# Patient Record
Sex: Female | Born: 1960 | Race: Black or African American | Hispanic: No | Marital: Married | Smoking: Never smoker
Health system: Southern US, Community
[De-identification: ages and names within clinical notes are randomized; demographics above are authoritative.]

## PROBLEM LIST (undated history)

## (undated) DIAGNOSIS — M25562 Pain in left knee: Secondary | ICD-10-CM

## (undated) DIAGNOSIS — N915 Oligomenorrhea, unspecified: Secondary | ICD-10-CM

## (undated) DIAGNOSIS — T7840XA Allergy, unspecified, initial encounter: Secondary | ICD-10-CM

## (undated) DIAGNOSIS — I10 Essential (primary) hypertension: Secondary | ICD-10-CM

## (undated) DIAGNOSIS — E669 Obesity, unspecified: Secondary | ICD-10-CM

## (undated) DIAGNOSIS — K921 Melena: Secondary | ICD-10-CM

## (undated) DIAGNOSIS — N650 Deformity of reconstructed breast: Secondary | ICD-10-CM

## (undated) DIAGNOSIS — J3089 Other allergic rhinitis: Secondary | ICD-10-CM

## (undated) DIAGNOSIS — D219 Benign neoplasm of connective and other soft tissue, unspecified: Secondary | ICD-10-CM

## (undated) DIAGNOSIS — E559 Vitamin D deficiency, unspecified: Secondary | ICD-10-CM

## (undated) DIAGNOSIS — N870 Mild cervical dysplasia: Secondary | ICD-10-CM

## (undated) DIAGNOSIS — D126 Benign neoplasm of colon, unspecified: Secondary | ICD-10-CM

## (undated) DIAGNOSIS — Z6834 Body mass index (BMI) 34.0-34.9, adult: Secondary | ICD-10-CM

## (undated) DIAGNOSIS — Z8619 Personal history of other infectious and parasitic diseases: Secondary | ICD-10-CM

## (undated) DIAGNOSIS — IMO0001 Reserved for inherently not codable concepts without codable children: Secondary | ICD-10-CM

## (undated) HISTORY — DX: Reserved for inherently not codable concepts without codable children: IMO0001

## (undated) HISTORY — DX: Allergy, unspecified, initial encounter: T78.40XA

## (undated) HISTORY — DX: Benign neoplasm of connective and other soft tissue, unspecified: D21.9

## (undated) HISTORY — DX: Obesity, unspecified: E66.9

## (undated) HISTORY — DX: Oligomenorrhea, unspecified: N91.5

## (undated) HISTORY — DX: Body mass index (BMI) 34.0-34.9, adult: Z68.34

## (undated) HISTORY — DX: Essential (primary) hypertension: I10

## (undated) HISTORY — DX: Deformity of reconstructed breast: N65.0

## (undated) HISTORY — DX: Personal history of other infectious and parasitic diseases: Z86.19

## (undated) HISTORY — DX: Other allergic rhinitis: J30.89

## (undated) HISTORY — DX: Melena: K92.1

## (undated) HISTORY — DX: Vitamin D deficiency, unspecified: E55.9

## (undated) HISTORY — DX: Pain in left knee: M25.562

## (undated) HISTORY — DX: Benign neoplasm of colon, unspecified: D12.6

## (undated) HISTORY — DX: Mild cervical dysplasia: N87.0

---

## 2001-09-13 HISTORY — PX: FOOT BONE EXCISION: SUR493

## 2002-07-14 DIAGNOSIS — D126 Benign neoplasm of colon, unspecified: Secondary | ICD-10-CM | POA: Insufficient documentation

## 2002-07-14 HISTORY — DX: Benign neoplasm of colon, unspecified: D12.6

## 2003-06-12 ENCOUNTER — Other Ambulatory Visit: Admission: RE | Admit: 2003-06-12 | Discharge: 2003-06-12 | Payer: Self-pay | Admitting: Obstetrics and Gynecology

## 2003-06-21 ENCOUNTER — Ambulatory Visit (HOSPITAL_COMMUNITY): Admission: RE | Admit: 2003-06-21 | Discharge: 2003-06-21 | Payer: Self-pay | Admitting: Obstetrics and Gynecology

## 2003-06-21 ENCOUNTER — Encounter: Payer: Self-pay | Admitting: Obstetrics and Gynecology

## 2004-08-31 ENCOUNTER — Other Ambulatory Visit: Admission: RE | Admit: 2004-08-31 | Discharge: 2004-08-31 | Payer: Self-pay | Admitting: Obstetrics and Gynecology

## 2004-10-08 ENCOUNTER — Ambulatory Visit (HOSPITAL_COMMUNITY): Admission: RE | Admit: 2004-10-08 | Discharge: 2004-10-08 | Payer: Self-pay | Admitting: Obstetrics and Gynecology

## 2005-09-29 ENCOUNTER — Other Ambulatory Visit: Admission: RE | Admit: 2005-09-29 | Discharge: 2005-09-29 | Payer: Self-pay | Admitting: Obstetrics and Gynecology

## 2005-10-19 ENCOUNTER — Ambulatory Visit (HOSPITAL_COMMUNITY): Admission: RE | Admit: 2005-10-19 | Discharge: 2005-10-19 | Payer: Self-pay | Admitting: Obstetrics and Gynecology

## 2006-12-29 ENCOUNTER — Ambulatory Visit (HOSPITAL_COMMUNITY): Admission: RE | Admit: 2006-12-29 | Discharge: 2006-12-29 | Payer: Self-pay | Admitting: Obstetrics and Gynecology

## 2008-01-09 ENCOUNTER — Encounter: Admission: RE | Admit: 2008-01-09 | Discharge: 2008-01-09 | Payer: Self-pay | Admitting: Obstetrics and Gynecology

## 2009-09-29 ENCOUNTER — Ambulatory Visit (HOSPITAL_COMMUNITY): Admission: RE | Admit: 2009-09-29 | Discharge: 2009-09-29 | Payer: Self-pay | Admitting: Obstetrics and Gynecology

## 2010-10-02 ENCOUNTER — Ambulatory Visit (HOSPITAL_COMMUNITY)
Admission: RE | Admit: 2010-10-02 | Discharge: 2010-10-02 | Payer: Self-pay | Source: Home / Self Care | Attending: Obstetrics and Gynecology | Admitting: Obstetrics and Gynecology

## 2010-10-04 ENCOUNTER — Encounter: Payer: Self-pay | Admitting: Obstetrics and Gynecology

## 2011-04-15 ENCOUNTER — Encounter: Payer: Self-pay | Admitting: *Deleted

## 2011-04-15 ENCOUNTER — Encounter: Payer: BC Managed Care – PPO | Attending: Family Medicine | Admitting: *Deleted

## 2011-04-15 DIAGNOSIS — Z713 Dietary counseling and surveillance: Secondary | ICD-10-CM | POA: Insufficient documentation

## 2011-04-15 DIAGNOSIS — E669 Obesity, unspecified: Secondary | ICD-10-CM | POA: Insufficient documentation

## 2011-04-15 NOTE — Patient Instructions (Addendum)
Goals:  Eat 3 meals/day, Avoid meal skipping.  Increase protein rich foods to all meals and snacks.  Follow "Plate Method" for portion control (follow yellow card).  Choose more whole grains, lean protein, low-fat dairy, and fruits/non-starchy vegetables.   Aim for >30 min of physical activity daily.  Limit sugar-sweetened beverages, concentrated sweets, and fried foods.  Limit meals away from home.  Increase fiber to 25-30 grams and limit sodium to <2000 mg daily.  Keep food journal for 1 week and bring to next visit. Measure portions to get familiar with amounts.

## 2011-04-15 NOTE — Progress Notes (Signed)
Medical Nutrition Therapy    Appt start time: 12:00p  end time: 1:00p.  Assessment:  Primary concerns today: Obesity.  Pt here for diet therapy r/t obesity. Reports skipping breakfast 4 d/wk, though has stopped consuming fried foods, sodas, and sweet tea. States she no longer eats out.  No structured exercise noted, though pt states she plans to resume.  Food recall shows excessive CHO at night, which she states is her "problem" area.  Pt started phentermine a few weeks ago, which "seems be helping".  MEDICATIONS: Vit D, lotrel, jolivette (BCP), phentermine (started recently)   DIETARY INTAKE 24-hr recall:  B (AM): Yogurt, cheese, or oatmeal (3x/wk) OR SKIPS; decaf coffee (1 c)  Snk (AM): none  L (PM):  Peanut butter sandwich, carrots or apple; water OR nabs Snk (PM): none D (PM): Baked chicken and pasta; [place] hot dogs (no preservatives), salad, broccoli  Snk (HS): few small bags of chips, lance pb nabs, hummus w/ wheat crackers  Usual physical activity: No exercise noted.  Estimated energy needs: 1300-1400 calories 160-175 g carbohydrates 80-85 g protein 35-40 g fat 25-30 g fiber <2000 mg sodium  Progress Towards Goal(s):  NEW.   Nutritional Diagnosis:  -3.3 Obesity related to excessive intake of CHO as evidenced by pt-reported 24 hour recall and a BMI of 33.3 kg/m2.    Intervention/Goals:  Eat 3 meals/day, Avoid meal skipping.  Increase protein rich foods to all meals and snacks.  Follow "Plate Method" for portion control (follow yellow card).  Choose more whole grains, lean protein, low-fat dairy, and fruits/non-starchy vegetables.   Aim for >30 min of physical activity daily.  Limit sugar-sweetened beverages, concentrated sweets, and fried foods.  Limit meals away from home.  Increase fiber to 25-30 grams and limit sodium to <2000 mg daily.  Keep food journal for 1 week and bring to next visit. Measure portions to get familiar with amounts.     Monitoring/Evaluation:  Dietary intake, exercise, and body weight in 3 month(s).

## 2011-04-17 ENCOUNTER — Encounter: Payer: Self-pay | Admitting: *Deleted

## 2011-09-27 ENCOUNTER — Other Ambulatory Visit (HOSPITAL_COMMUNITY): Payer: Self-pay | Admitting: Obstetrics and Gynecology

## 2011-09-27 DIAGNOSIS — Z1231 Encounter for screening mammogram for malignant neoplasm of breast: Secondary | ICD-10-CM

## 2011-09-27 DIAGNOSIS — Z0000 Encounter for general adult medical examination without abnormal findings: Secondary | ICD-10-CM

## 2011-10-07 ENCOUNTER — Ambulatory Visit (HOSPITAL_COMMUNITY)
Admission: RE | Admit: 2011-10-07 | Discharge: 2011-10-07 | Disposition: A | Discharge status: Home / Self Care | Payer: BC Managed Care – PPO | Source: Ambulatory Visit | Attending: Obstetrics and Gynecology | Admitting: Obstetrics and Gynecology

## 2011-10-07 DIAGNOSIS — Z1231 Encounter for screening mammogram for malignant neoplasm of breast: Secondary | ICD-10-CM

## 2012-04-01 ENCOUNTER — Other Ambulatory Visit: Payer: Self-pay | Admitting: Obstetrics and Gynecology

## 2012-04-03 NOTE — Telephone Encounter (Signed)
Rx for Errin e-pres to pharm on file per pharm request. AEX sched 04-27-12 with Dr. Pennie Rushing.

## 2012-04-04 ENCOUNTER — Ambulatory Visit: Payer: Self-pay | Admitting: Obstetrics and Gynecology

## 2012-04-27 ENCOUNTER — Ambulatory Visit (INDEPENDENT_AMBULATORY_CARE_PROVIDER_SITE_OTHER): Payer: BC Managed Care – PPO | Admitting: Obstetrics and Gynecology

## 2012-04-27 ENCOUNTER — Encounter: Payer: Self-pay | Admitting: Obstetrics and Gynecology

## 2012-04-27 VITALS — BP 110/72 | Ht 68.0 in | Wt 210.0 lb

## 2012-04-27 DIAGNOSIS — J3089 Other allergic rhinitis: Secondary | ICD-10-CM | POA: Insufficient documentation

## 2012-04-27 DIAGNOSIS — D259 Leiomyoma of uterus, unspecified: Secondary | ICD-10-CM

## 2012-04-27 DIAGNOSIS — E559 Vitamin D deficiency, unspecified: Secondary | ICD-10-CM | POA: Insufficient documentation

## 2012-04-27 DIAGNOSIS — Z8619 Personal history of other infectious and parasitic diseases: Secondary | ICD-10-CM | POA: Insufficient documentation

## 2012-04-27 DIAGNOSIS — N650 Deformity of reconstructed breast: Secondary | ICD-10-CM | POA: Insufficient documentation

## 2012-04-27 DIAGNOSIS — E669 Obesity, unspecified: Secondary | ICD-10-CM | POA: Insufficient documentation

## 2012-04-27 DIAGNOSIS — N870 Mild cervical dysplasia: Secondary | ICD-10-CM

## 2012-04-27 DIAGNOSIS — D219 Benign neoplasm of connective and other soft tissue, unspecified: Secondary | ICD-10-CM | POA: Insufficient documentation

## 2012-04-27 DIAGNOSIS — Z124 Encounter for screening for malignant neoplasm of cervix: Secondary | ICD-10-CM

## 2012-04-27 DIAGNOSIS — Z6834 Body mass index (BMI) 34.0-34.9, adult: Secondary | ICD-10-CM | POA: Insufficient documentation

## 2012-04-27 DIAGNOSIS — D126 Benign neoplasm of colon, unspecified: Secondary | ICD-10-CM

## 2012-04-27 DIAGNOSIS — Z309 Encounter for contraceptive management, unspecified: Secondary | ICD-10-CM

## 2012-04-27 DIAGNOSIS — IMO0001 Reserved for inherently not codable concepts without codable children: Secondary | ICD-10-CM | POA: Insufficient documentation

## 2012-04-27 DIAGNOSIS — N915 Oligomenorrhea, unspecified: Secondary | ICD-10-CM

## 2012-04-27 DIAGNOSIS — I10 Essential (primary) hypertension: Secondary | ICD-10-CM

## 2012-04-27 MED ORDER — NORETHINDRONE 0.35 MG PO TABS: 1.0000 | ORAL_TABLET | Freq: Every day | ORAL | Status: AC

## 2012-04-27 NOTE — Progress Notes (Signed)
Subjective:   AEX:  Last Pap: 03/14/2012 WNL: Yes Regular Periods:yes Contraception: BC Pill  Monthly Breast exam:yes Tetanus<52yrs: unsure Nl.Bladder Function:yes Daily BMs:yes Healthy Diet:yes Calcium:no Mammogram:yes Date of Mammogram: 10/07/2011 Exercise:yes Have often Exercise: occasionally Seatbelt: yes Abuse at home: no Stressful work:yes Sigmoid-colonoscopy: 2004 WNL due 2014 with Dr. Loreta Ave Bone Density: No PCP: Joes[facility] Fill[facility], [facility]  Change in PMH: None Change in [facility]: None    Ashley Maddox is a 51 y.o. female, G2P2, who presents for an annual exam. She has started to have occassional hot flashes    History   Social History  . Marital Status: Married    Spouse Name: N/A    Number of Children: N/A  . Years of Education: N/A   Social History Main Topics  . Smoking status: Never Smoker   . Smokeless tobacco: Never Used  . Alcohol Use: No  . Drug Use: No  . Sexually Active: Yes    Birth Control/ Protection: Pill   Other Topics Concern  . None   Social History Narrative  . None    Menstrual cycle:   LMP: Patient's last menstrual period was 12/20/2011.           Cycle: every 3 months with current BCP.    The following portions of the patient's history were reviewed and updated as appropriate: allergies, current medications, past family history, past medical history, past social history, past surgical history and problem list.  Review of Systems Pertinent items are noted in HPI. Breast:Negative for breast lump,nipple discharge or nipple retraction Gastrointestinal: Negative for abdominal pain, change in bowel habits or rectal bleeding Urinary:negative   Objective:    BP 110/72  Ht 5\' 8"  (1.727 m)  Wt 210 lb (95.255 kg)  BMI 31.93 kg/m2  LMP 12/20/2011    Weight:  Wt Readings from Last 1 Encounters:  04/27/12 210 lb (95.255 kg)          BMI: Body mass index is 31.93 kg/(m^2).  General Appearance: Alert, appropriate appearance for age. No  acute distress HEENT: Grossly normal Neck / Thyroid: Supple, no masses, nodes or enlargement Lungs: clear to auscultation bilaterally Back: No CVA tenderness Breast Exam: No masses or nodes.No dimpling, nipple retraction or discharge. Cardiovascular: Regular rate and rhythm. S1, S2, no murmur Gastrointestinal: Soft, non-tender, no masses or organomegaly Pelvic Exam: External genitalia: normal general appearance Vaginal: normal mucosa without prolapse or lesions Cervix: normal appearance Adnexa: no masses Uterus: irregular enlargement Exam limited by body habitus, stable Rectovaginal: normal rectal, no masses Lymphatic Exam: Non-palpable nodes in neck, clavicular, axillary, or inguinal regions Skin: no rash or abnormalities Neurologic: Normal gait and speech, no tremor  Psychiatric: Alert and oriented, appropriate affect.   Wet Prep:not applicable Urinalysis:not applicable UPT: Not done   Assessment:    asymptomatic uterine fibroids  Early symptoms of menopause LGSIL Oligomenorrhea partially due to perimenopause partially due to progesterone only report control pills   Plan:    mammogram pap smear return annually or prn STD screening: declined Contraception:oral progesterone-only contraceptive.  Will continue birth control pills until one year after menopause is documented.      Dierdre Forth MD

## 2012-05-01 LAB — PAP IG W/ RFLX HPV ASCU

## 2012-05-03 LAB — HUMAN PAPILLOMAVIRUS, HIGH RISK: HPV DNA High Risk: NOT DETECTED

## 2012-05-05 ENCOUNTER — Encounter: Payer: Self-pay | Admitting: Obstetrics and Gynecology

## 2012-05-05 DIAGNOSIS — R896 Abnormal cytological findings in specimens from other organs, systems and tissues: Secondary | ICD-10-CM

## 2012-05-08 ENCOUNTER — Encounter: Payer: Self-pay | Admitting: Obstetrics and Gynecology

## 2012-06-14 ENCOUNTER — Telehealth: Payer: Self-pay

## 2012-06-14 NOTE — Telephone Encounter (Signed)
Voice message from pt rgdg pap smear results. Lm on vm for pt to cb.

## 2012-06-15 NOTE — Telephone Encounter (Signed)
Tc from pt. Pt with several questions regarding h/o abn pap smears. Appt sched 06/20/12 with vph to discuss. Pt agrees.

## 2012-06-20 ENCOUNTER — Encounter: Payer: Self-pay | Admitting: Obstetrics and Gynecology

## 2012-06-20 ENCOUNTER — Ambulatory Visit (INDEPENDENT_AMBULATORY_CARE_PROVIDER_SITE_OTHER): Payer: BC Managed Care – PPO | Admitting: Obstetrics and Gynecology

## 2012-06-20 VITALS — BP 108/72 | Temp 98.6°F | Wt 218.0 lb

## 2012-06-20 DIAGNOSIS — R87610 Atypical squamous cells of undetermined significance on cytologic smear of cervix (ASC-US): Secondary | ICD-10-CM

## 2012-06-20 DIAGNOSIS — IMO0002 Reserved for concepts with insufficient information to code with codable children: Secondary | ICD-10-CM

## 2012-06-20 NOTE — Progress Notes (Signed)
GYN PROBLEM VISIT  Subjective: Ms. Ashley Maddox is a 51 y.o. year old female,G2P2, who presents for a problem visit. She wants to discuss followup of abnormal pap smears.  In 2011 she has LGSIL evaluated with colposcopy with biopsies consistent with this pap.  She has had f/u paps that were normal on 2 occassions in 2012.  Her most recent pap 04/27/12 showed ASCUS with negative HR HPV.     Objective:  BP 108/72  Temp 98.6 F (37 C)  Wt 218 lb (98.884 kg)    Assessment: HX LGSIL with most recent pap showing ASCUS and neg HR HPV  Plan: Rationale for recommendation for followup pap in 1 year reviewed in detail as well as recommended protocol for cervical cancer screening in general. Pt had the opportunity to ask questions and agreed to comply with the recommendation. She understands she is to report any change in sx such as vaginal bleeding, dyspareunia, or abnormal vaginal discharge  Return to office 8/14 for aex and pap   Dierdre Forth, MD  06/20/2012 11:49 AM

## 2012-11-08 ENCOUNTER — Other Ambulatory Visit: Payer: Self-pay | Admitting: Obstetrics and Gynecology

## 2012-11-08 DIAGNOSIS — Z1231 Encounter for screening mammogram for malignant neoplasm of breast: Secondary | ICD-10-CM

## 2012-11-15 ENCOUNTER — Ambulatory Visit (HOSPITAL_COMMUNITY): Payer: BC Managed Care – PPO

## 2012-11-21 ENCOUNTER — Ambulatory Visit (HOSPITAL_COMMUNITY)
Admission: RE | Admit: 2012-11-21 | Discharge: 2012-11-21 | Disposition: A | Discharge status: Home / Self Care | Payer: BC Managed Care – PPO | Source: Ambulatory Visit | Attending: Obstetrics and Gynecology | Admitting: Obstetrics and Gynecology

## 2012-11-21 DIAGNOSIS — Z1231 Encounter for screening mammogram for malignant neoplasm of breast: Secondary | ICD-10-CM | POA: Insufficient documentation

## 2012-11-22 ENCOUNTER — Encounter: Payer: Self-pay | Admitting: Obstetrics and Gynecology

## 2012-11-22 DIAGNOSIS — R922 Inconclusive mammogram: Secondary | ICD-10-CM | POA: Insufficient documentation

## 2013-12-26 ENCOUNTER — Other Ambulatory Visit: Payer: Self-pay | Admitting: Obstetrics and Gynecology

## 2013-12-26 DIAGNOSIS — Z1231 Encounter for screening mammogram for malignant neoplasm of breast: Secondary | ICD-10-CM

## 2014-01-03 ENCOUNTER — Ambulatory Visit (HOSPITAL_COMMUNITY): Payer: BC Managed Care – PPO

## 2014-01-11 ENCOUNTER — Ambulatory Visit (HOSPITAL_COMMUNITY)
Admission: RE | Admit: 2014-01-11 | Discharge: 2014-01-11 | Disposition: A | Discharge status: Home / Self Care | Payer: BC Managed Care – PPO | Source: Ambulatory Visit | Attending: Obstetrics and Gynecology | Admitting: Obstetrics and Gynecology

## 2014-01-11 DIAGNOSIS — Z1231 Encounter for screening mammogram for malignant neoplasm of breast: Secondary | ICD-10-CM | POA: Insufficient documentation

## 2014-07-15 ENCOUNTER — Encounter: Payer: Self-pay | Admitting: Obstetrics and Gynecology

## 2015-01-13 ENCOUNTER — Other Ambulatory Visit (HOSPITAL_COMMUNITY): Payer: Self-pay | Admitting: Obstetrics and Gynecology

## 2015-01-13 DIAGNOSIS — Z1231 Encounter for screening mammogram for malignant neoplasm of breast: Secondary | ICD-10-CM

## 2015-01-17 ENCOUNTER — Ambulatory Visit (HOSPITAL_COMMUNITY): Payer: BC Managed Care – PPO

## 2015-01-31 ENCOUNTER — Ambulatory Visit (HOSPITAL_COMMUNITY): Payer: BC Managed Care – PPO

## 2015-02-07 ENCOUNTER — Ambulatory Visit (HOSPITAL_COMMUNITY)
Admission: RE | Admit: 2015-02-07 | Discharge: 2015-02-07 | Disposition: A | Discharge status: Home / Self Care | Payer: BC Managed Care – PPO | Source: Ambulatory Visit | Attending: Obstetrics and Gynecology | Admitting: Obstetrics and Gynecology

## 2015-02-07 DIAGNOSIS — Z1231 Encounter for screening mammogram for malignant neoplasm of breast: Secondary | ICD-10-CM | POA: Insufficient documentation

## 2015-04-11 ENCOUNTER — Encounter: Payer: Self-pay | Admitting: Internal Medicine

## 2015-06-19 ENCOUNTER — Encounter: Payer: BC Managed Care – PPO | Admitting: Internal Medicine

## 2015-07-29 ENCOUNTER — Ambulatory Visit (AMBULATORY_SURGERY_CENTER): Payer: Self-pay

## 2015-07-29 VITALS — Ht 67.5 in | Wt 217.4 lb

## 2015-07-29 DIAGNOSIS — Z86010 Personal history of colonic polyps: Secondary | ICD-10-CM

## 2015-07-29 MED ORDER — NA SULFATE-K SULFATE-MG SULF 17.5-3.13-1.6 GM/177ML PO SOLN: ORAL | Status: AC

## 2015-07-29 NOTE — Progress Notes (Signed)
Per pt, no allergies to soy or egg products.Pt not taking any weight loss meds or using  O2 at home.   Pt came into the office today for her pre-visit prior to her colonoscopy with Dr Hilarie Fredrickson on 08/13/15.Pt states she had a colonoscopy done over 12 years ago,( in [place]) due to blood in stool.She states it was due to stress.The pt will bring a copy of the report to her next appt.

## 2015-08-01 ENCOUNTER — Encounter: Payer: Self-pay | Admitting: Internal Medicine

## 2015-08-13 ENCOUNTER — Encounter: Payer: BC Managed Care – PPO | Admitting: Internal Medicine

## 2017-07-19 ENCOUNTER — Other Ambulatory Visit (HOSPITAL_COMMUNITY): Payer: Self-pay | Admitting: Endocrinology

## 2017-07-19 DIAGNOSIS — C73 Malignant neoplasm of thyroid gland: Secondary | ICD-10-CM

## 2017-07-20 ENCOUNTER — Other Ambulatory Visit (HOSPITAL_COMMUNITY): Payer: Self-pay | Admitting: Endocrinology

## 2017-07-20 DIAGNOSIS — C73 Malignant neoplasm of thyroid gland: Secondary | ICD-10-CM

## 2017-08-24 ENCOUNTER — Encounter (HOSPITAL_COMMUNITY): Admission: RE | Admit: 2017-08-24 | Payer: BC Managed Care – PPO | Source: Ambulatory Visit

## 2017-08-25 ENCOUNTER — Encounter (HOSPITAL_COMMUNITY): Payer: BC Managed Care – PPO

## 2017-08-26 ENCOUNTER — Encounter (HOSPITAL_COMMUNITY): Payer: BC Managed Care – PPO

## 2017-08-29 ENCOUNTER — Encounter (HOSPITAL_COMMUNITY)
Admission: RE | Admit: 2017-08-29 | Discharge: 2017-08-29 | Disposition: A | Discharge status: Home / Self Care | Payer: BC Managed Care – PPO | Source: Ambulatory Visit | Attending: Endocrinology | Admitting: Endocrinology

## 2017-08-29 DIAGNOSIS — C73 Malignant neoplasm of thyroid gland: Secondary | ICD-10-CM | POA: Diagnosis present

## 2017-08-29 MED ORDER — STERILE WATER FOR INJECTION IJ SOLN
INTRAMUSCULAR | Status: AC
Start: 2017-08-29 — End: 2017-08-29
  Filled 2017-08-29: qty 10

## 2017-08-29 MED ORDER — THYROTROPIN ALFA 1.1 MG IM SOLR
0.9000 mg | INTRAMUSCULAR | Status: AC
  Administered 2017-08-29: 0.9 mg via INTRAMUSCULAR

## 2017-08-30 ENCOUNTER — Encounter (HOSPITAL_COMMUNITY)
Admission: RE | Admit: 2017-08-30 | Discharge: 2017-08-30 | Disposition: A | Discharge status: Home / Self Care | Payer: BC Managed Care – PPO | Source: Ambulatory Visit | Attending: Endocrinology | Admitting: Endocrinology

## 2017-08-30 DIAGNOSIS — C73 Malignant neoplasm of thyroid gland: Secondary | ICD-10-CM | POA: Diagnosis not present

## 2017-08-30 MED ORDER — THYROTROPIN ALFA 1.1 MG IM SOLR
0.9000 mg | INTRAMUSCULAR | Status: AC
  Administered 2017-08-30: 0.9 mg via INTRAMUSCULAR

## 2017-08-30 MED ORDER — STERILE WATER FOR INJECTION IJ SOLN
INTRAMUSCULAR | Status: AC
  Filled 2017-08-30: qty 10

## 2017-08-31 ENCOUNTER — Encounter (HOSPITAL_COMMUNITY)
Admission: RE | Admit: 2017-08-31 | Discharge: 2017-08-31 | Disposition: A | Discharge status: Home / Self Care | Payer: BC Managed Care – PPO | Source: Ambulatory Visit | Attending: Endocrinology | Admitting: Endocrinology

## 2017-08-31 DIAGNOSIS — C73 Malignant neoplasm of thyroid gland: Secondary | ICD-10-CM | POA: Diagnosis not present

## 2017-08-31 LAB — HCG, SERUM, QUALITATIVE: Preg, Serum: NEGATIVE

## 2017-08-31 MED ORDER — SODIUM IODIDE I 131 CAPSULE
99.6000 | Freq: Once | INTRAVENOUS | Status: AC | PRN
  Administered 2017-08-31: 99.6 via ORAL

## 2017-09-05 ENCOUNTER — Encounter (HOSPITAL_COMMUNITY): Payer: BC Managed Care – PPO

## 2017-09-09 ENCOUNTER — Encounter (HOSPITAL_COMMUNITY)
Admission: RE | Admit: 2017-09-09 | Discharge: 2017-09-09 | Disposition: A | Discharge status: Home / Self Care | Payer: BC Managed Care – PPO | Source: Ambulatory Visit | Attending: Endocrinology | Admitting: Endocrinology

## 2017-09-09 DIAGNOSIS — C73 Malignant neoplasm of thyroid gland: Secondary | ICD-10-CM | POA: Insufficient documentation

## 2018-07-23 IMAGING — NM NM RAI THYROID CANCER W/ THYROGEN
1 series · 1 of 1 positions shown · non-contrast
Comparison: none

CLINICAL DATA: Papillary thyroid cancer status post total
thyroidectomy 05/31/2017. [id number]. Adjuvant therapy.

[st static image · 1 of 1 slices shown]
[im 1/1]
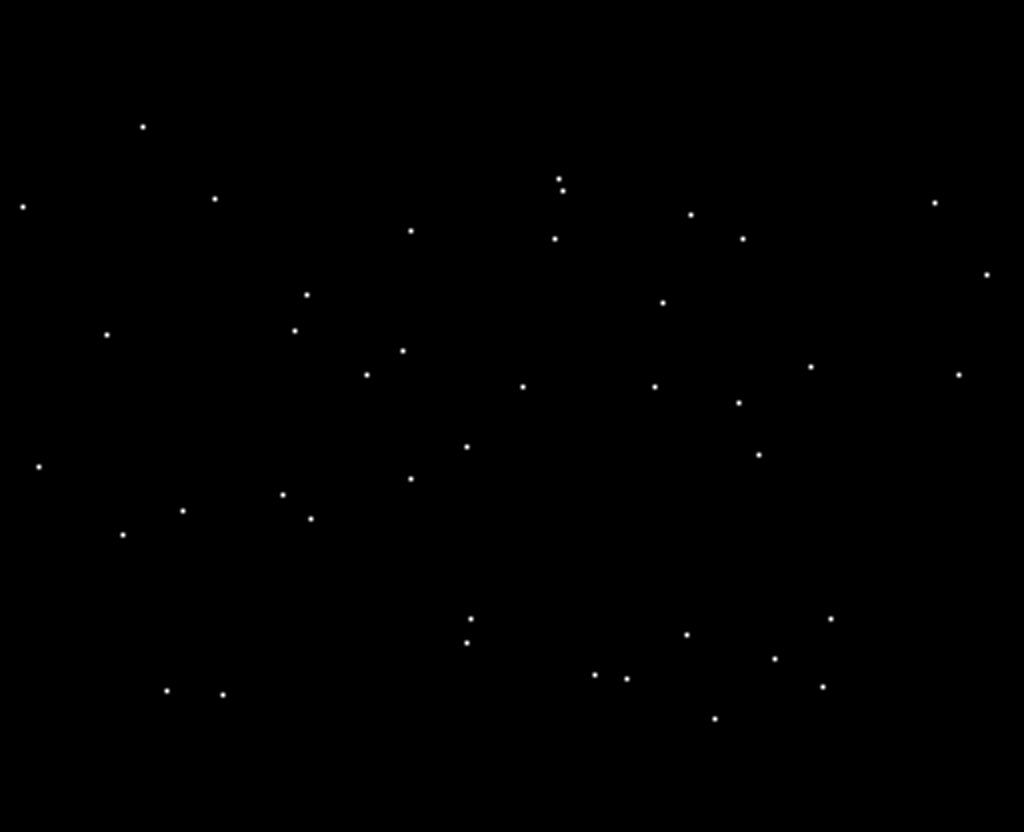

[1 of 1 positions shown; findings below may reference images not displayed]

EXAM:
RADIOACTIVE IODINE THERAPY FOR THYROID CANCER

PROCEDURE:
I prescribed the radioactive iodine after reviewing the patient's
prior studies on 08/12/2017. The risks and benefits of radioactive
iodine therapy were discussed with the patient in detail today by
Dr. Kagyi. Alternative therapies were also mentioned. Radiation
safety was discussed with the patient, including how to protect the
general public from exposure. There were no barriers to
communication. Written consent was obtained. The patient then
received a capsule containing the radiopharmaceutical.

The patient will follow-up with the referring physician.

RADIOPHARMACEUTICALS:  99.6 mCi J-DLD sodium iodide orally
FINDINGS: No imaging was performed.
IMPRESSION: Per oral administration of J-DLD sodium iodide for thyroid cancer
adjuvant therapy.

## 2019-08-02 ENCOUNTER — Other Ambulatory Visit: Payer: Self-pay

## 2019-08-02 DIAGNOSIS — Z20822 Contact with and (suspected) exposure to covid-19: Secondary | ICD-10-CM

## 2019-08-04 LAB — NOVEL CORONAVIRUS, NAA: SARS-CoV-2, NAA: NOT DETECTED

## 2019-08-23 ENCOUNTER — Encounter: Payer: Self-pay | Admitting: Registered"

## 2019-08-23 ENCOUNTER — Other Ambulatory Visit: Payer: Self-pay

## 2019-08-23 ENCOUNTER — Encounter: Payer: BC Managed Care – PPO | Attending: Family Medicine | Admitting: Registered"

## 2019-08-23 DIAGNOSIS — Z713 Dietary counseling and surveillance: Secondary | ICD-10-CM | POA: Insufficient documentation

## 2019-08-23 NOTE — Patient Instructions (Signed)
Aim to eat more balanced meals and snacks if hungry Pay attention to your energy level to see what foods and timing of foods make you feel better Consider taking a break at lunch to eat your food mindfully. Continue getting good sleep Consider yoga as a form of resistance exercise/stretch After you are nourished well and feel like you have more energy, doing more aerobic exercise would be the next step.

## 2019-08-23 NOTE — Progress Notes (Signed)
Medical Nutrition Therapy:  Appt start time: [id number] end time:  1715.   Assessment:  Primary concerns today: Would like to make changes to diet and lifestyle for optimal health and disease prevention. Pt reports family history of T2DM.  Patient states she plans to retire next year.   Physical activity: Pt states she used to have a walking buddy, but since she no longer works with her she is not getting much exercise. Pt states she also used to go to [facility]. Pt states her job is somewhat active, but more sedentary since [employer]. Pt states she usually works through lunch, may just eat at desk, might read at lunch. Patient states her goal is get into routine to take walk at lunchtime.  Pt states rarely fries food, occasionally red meat. Pt reports she doesn't think she should be eating potatoes. Pt states she limits pork d/t HTN and had questions about pork effect on BP.   Pt states when she gets home from work will snack on nuts or other food to giver her energy to cook dinner.  Pt had questions regarding appropriate amount of calories she should be eating to lose weight. Pt states she just wants an idea but is not interested in tracking her food or counting calories. Pt states she came to [place] several years ago and thinks she was told 1300 calories. Pt states her husband saw a nutritionist several years ago and has lost 50 lbs and kept it off. Pt states he has more time to exercise and only eats sweets on the weekend.  Sleep: 8 hrs. Pt reports she prioritizes sleep.  Preferred Learning Style:   No preference indicated   Learning Readiness:   Contemplating  MEDICATIONS: reviewed   DIETARY INTAKE:  24-hr recall:  B ( AM): banana, walnuts OR oatmeal (flavored) OR Ritz and [place] sausage Snk ( AM): apple OR cheese, crackers  L ( PM): left overs OR 2x month chik-fil-a OR fruit, walnuts OR salad Snk ( PM): nuts other snack food D ( PM): vegetables, chicken, OR tacos with shrimp, chicken, corn &  rice OR 2-4x week salad with dinner OR big salad for dinner OR spaghetti, salad, corn Snk (8:30 PM): cheddar cheese, crackers OR popcorn Beverages: 2-16 oz water before 11 am, 6 oz V8 splash 1-2x week.  Usual physical activity: ADLs  Estimated energy needs: 1600 calories   Progress Towards Goal(s):  New goal.   Nutritional Diagnosis:  NB-1.1 Food and nutrition-related knowledge deficit As related to balanced eating.  As evidenced by dietary recall and stated lack of understanding prior to visit.    Intervention:  Nutrition Education. Discussed balanced eating. Discussed mindful eating. Discussed roll of exercise and resistance exercise.   Plan: Aim to eat more balanced meals and snacks if hungry Pay attention to your energy level to see what foods and timing of foods make you feel better Consider taking a break at lunch to eat your food mindfully. Continue getting good sleep Consider yoga as a form of resistance exercise/stretch After you are nourished well and feel like you have more energy, doing more aerobic exercise would be the next step.  Teaching Method Utilized:  Visual Auditory   Handouts given during visit include:  MyPlate Planner  Snack sheet  Barriers to learning/adherence to lifestyle change: none  Demonstrated degree of understanding via:  Teach Back   Monitoring/Evaluation:  Dietary intake, exercise, mindful eating progress, and body weight prn.

## 2021-09-02 ENCOUNTER — Ambulatory Visit: Payer: BC Managed Care – PPO | Admitting: Dietician

## 2021-10-09 ENCOUNTER — Encounter: Payer: BC Managed Care – PPO | Attending: Nurse Practitioner | Admitting: Registered"

## 2021-10-09 DIAGNOSIS — R7303 Prediabetes: Secondary | ICD-10-CM | POA: Insufficient documentation

## 2021-10-15 ENCOUNTER — Encounter: Payer: Self-pay | Admitting: Registered"

## 2021-10-15 NOTE — Progress Notes (Signed)
On 10/09/21 patient completed Core Session 1 of Diabetes Prevention Program course virtually with [facility]. The following learning objectives were met by the patient during this class:   Virtual Visit via Video Note  I connected with [name] by a video enabled application and verified that I am speaking with the correct person using two identifiers.   I discussed the limitations of evaluation and management by telemedicine and the availability of in person appointments. The patient expressed understanding and agreed to proceed.  Location: Patient: Home.  Provider: Office.    Learning Objectives:  Be able to explain the purpose and benefits of the National Diabetes Prevention Program.  Be able to describe the events that will take place at every session.  Know the weight loss and physical activity goals established by the [facility] Diabetes Prevention Program.  Know their own individual weight loss and physical activity goals.  Be able to explain the important effect of self-monitoring on behavior change.   Goals:  Record food and beverage intake in "Food and Activity Tracker" over the next week.  E-mail completed "Food and Activity Tracker" to Lifestyle Coach next week before session 2. Circle the foods or beverages you think are highest in fat and calories in your food tracker. Read the labels on the food you buy, and consider using measuring cups and spoons to help you calculate the amount you eat. We will talk about measuring in more detail in the coming weeks.   Follow-Up Plan: Attend Core Session 2 next week.  E-mail completed "Food and Activity Tracker" to Lifestyle Coach next week before class.

## 2021-10-16 ENCOUNTER — Encounter: Payer: BC Managed Care – PPO | Attending: Nurse Practitioner | Admitting: Registered"

## 2021-10-16 ENCOUNTER — Encounter: Payer: Self-pay | Admitting: Registered"

## 2021-10-16 DIAGNOSIS — R7303 Prediabetes: Secondary | ICD-10-CM | POA: Insufficient documentation

## 2021-10-16 NOTE — Progress Notes (Signed)
On 10/16/21 patient completed Core Session 2 of Diabetes Prevention Program course virtually with [facility]. The following learning objectives were met by the patient during this class:  ° °Virtual Visit via Video Note ° °I connected with Ming Steptoe by a video enabled application and verified that I am speaking with the correct person using two identifiers. °  °I discussed the limitations of evaluation and management by telemedicine and the availability of in person appointments. The patient expressed understanding and agreed to proceed. ° °Location: °Patient: Home.  °Provider: Office.  ° °Learning Objectives: °Self-monitor their weight during the weeks following Session 2.  °Describe the relationship between fat and calories.  °Explain the reason for, and basic principles of, self-monitoring fat grams and calories.  °Identify their personal fat gram goals.  °Use the ?Fat and Calorie Counter to calculate the calories and fat grams of a given selection of foods.  °Keep a running total of the fat grams they eat each day.  °Calculate fat, calories, and serving sizes from nutrition labels.  ° °Goals:  °Weigh yourself at the same time each day, or every few days, and record your weight in your Food and Activity Tracker. °Write down everything you eat and drink in your Food and Activity Tracker. °Measure portions as much as you can, and start reading labels.  °Use the ?Fat and Calorie Counter to figure out the amount of fat and calories in what you ate, and write the amount down in your Food and Activity Tracker. °Keep a running fat gram total throughout the day. Come as close to your fat gram goal as you can.  ° °Follow-Up Plan: °Attend Core Session 3.  °Email completed  "Food and Activity Tracker" to Lifestyle Coach next week.  ° °

## 2021-10-30 ENCOUNTER — Encounter: Payer: BC Managed Care – PPO | Admitting: Registered"

## 2021-10-30 DIAGNOSIS — R7303 Prediabetes: Secondary | ICD-10-CM

## 2021-11-06 ENCOUNTER — Encounter: Payer: BC Managed Care – PPO | Admitting: Registered"

## 2021-11-06 DIAGNOSIS — R7303 Prediabetes: Secondary | ICD-10-CM

## 2021-11-10 ENCOUNTER — Encounter: Payer: Self-pay | Admitting: Registered"

## 2021-11-10 NOTE — Progress Notes (Signed)
On 10/30/21 patient completed Core Session 3 of Diabetes Prevention Program course virtually with [facility]. The following learning objectives were met by the patient during this class:   Virtual Visit via Video Note  I connected with [name] by a video enabled application and verified that I am speaking with the correct person using two identifiers.  Location: Patient: Home.  Provider: Office.   Learning Objectives: Weigh and measure foods. Estimate the fat and calorie content of common foods. Describe three ways to eat less fat and fewer calories. Create a plan to eat less fat for the following week.   Goals:  Track weight when weighing outside of class.  Track food and beverages eaten each day in Food and Activity Tracker and include fat grams and calories for each.  Try to stay within fat gram goal.  Complete plan for eating less high fat foods and answer related homework questions.    Follow-Up Plan: Attend Core Session 4 next week.  Bring completed "Food and Activity Tracker" next week to be reviewed by Lifestyle Coach.

## 2021-11-13 ENCOUNTER — Encounter: Payer: BC Managed Care – PPO | Attending: Nurse Practitioner | Admitting: Registered"

## 2021-11-13 DIAGNOSIS — R7303 Prediabetes: Secondary | ICD-10-CM | POA: Insufficient documentation

## 2021-11-17 ENCOUNTER — Encounter: Payer: Self-pay | Admitting: Registered"

## 2021-11-17 NOTE — Progress Notes (Signed)
On 11/06/21 patient completed Core Session 4 of Diabetes Prevention Program course virtually with [facility]. The following learning objectives were met by the patient during this class:   Virtual Visit via Video Note  I connected with Ashley Maddox on by a video enabled application and verified that I am speaking with the correct person using two identifiers.  I discussed the limitations of evaluation and management by telemedicine and the availability of in person appointments. The patient expressed understanding and agreed to proceed.  Location: Patient: Home.  Provider: Office.   Learning Objectives: Describe the MyPlate food guide and its recommendations, including how to reduce fat and calories in our diet. Compare and contrast MyPlate guidelines with participants eating habits. List ways to replace high-fat and high-calorie foods with low-fat and low-calorie foods. Explain the importance of eating plenty of whole grains, vegetables, and fruits, while staying within fat gram goals. Explain the importance of eating foods from all groups of MyPlate and of eating a variety of foods from within each group. Explain why a balanced diet is beneficial to health.  Goals:  Record weight taken outside of class.  Track foods and beverages eaten each day in the "Food and Activity Tracker," including calories and fat grams for each item.  Practice comparing what you eat with the recommendations of MyPlate using the "Rate Your Plate" handout.  Complete the "Rate Your Plate" handout form on at least 3 days.  Answer homework questions.   Follow-Up Plan: Attend Core Session 5 next week.  Email completed "Food and Activity Tracker" next week to be reviewed by Lifestyle Coach.

## 2021-11-17 NOTE — Progress Notes (Signed)
On 11/13/21 patient completed Core Session 5 of Diabetes Prevention Program course virtually with [facility]. The following learning objectives were met by the patient during this class:  ? ?Virtual Visit via Video Note ? ?I connected with Ravinder Six by a video enabled application and verified that I am speaking with the correct person using two identifiers. ?  ?I discussed the limitations of evaluation and management by telemedicine and the availability of in person appointments. The patient expressed understanding and agreed to proceed. ? ?Location: ?Patient: Home.  ?Provider: Office.  ? ?Learning Objectives: ?Establish a physical activity goal. ?Explain the importance of the physical activity goal. ?Describe their current level of physical activity. ?Name ways that they are already physically active. ?Develop personal plans for physical activity for the next week.  ? ?Goals:  ?Record weight taken outside of class.  ?Track foods and beverages eaten each day in the "Food and Activity Tracker," including calories and fat grams for each item.  ?Make an Activity Plan including date, specific type of activity, and length of time you plan to be active that includes at last 60 minutes of activity for the week.  ?Track activity type, minutes you were active, and distance you reached each day in the "Food and Activity Tracker."  ? ?Follow-Up Plan: ?Attend Core Session 6 next week.  ?E-mail completed "Food and Activity Tracker" to Lifestyle Coach next week before class ? ?

## 2021-11-27 ENCOUNTER — Encounter: Payer: BC Managed Care – PPO | Admitting: Registered"

## 2021-11-27 DIAGNOSIS — R7303 Prediabetes: Secondary | ICD-10-CM

## 2021-11-27 NOTE — Progress Notes (Signed)
On 11/27/21 patient completed Core Session 7 of Diabetes Prevention Program course virtually with [facility]. The following learning objectives were met by the patient during this class:  ? ?Virtual Visit via Video Note ? ?I connected with [name] by a video enabled application and verified that I am speaking with the correct person using two identifiers. ? ?Location: ?Patient: Home.  ?Provider: Office.  ? ?Learning Objectives: ?Define calorie balance. ?Explain how healthy eating and being active are related in terms of calorie balance.  ?Describe the relationship between calorie balance and weight loss.  ?Describe his or her progress as it relates to calorie balance.  ?Develop an activity plan for the coming week.  ? ?Goals:  ?Record weight taken outside of class.  ?Track foods and beverages eaten each day in the "Food and Activity Tracker," including calories and fat grams for each item.   ?Track activity type, minutes you were active, and distance you reached each day in the "Food and Activity Tracker."  ?Set aside one 20 to 30-minute block of time every day or find two or more periods of 10 to15 minutes each for physical activity.  ?Make a Physical Activities Plan for the Week.  ?Make active lifestyle choices all through the day  ?Stay at or go slightly over activity goal.  ? ?Follow-Up Plan: ?Attend Core Session 8 next week.  ?E-mail completed "Food and Activity Tracker" to Lifestyle Coach next week before class ? ?

## 2021-12-04 ENCOUNTER — Encounter: Payer: Self-pay | Admitting: Registered"

## 2021-12-04 ENCOUNTER — Encounter: Payer: BC Managed Care – PPO | Admitting: Registered"

## 2021-12-04 DIAGNOSIS — R7303 Prediabetes: Secondary | ICD-10-CM

## 2021-12-04 NOTE — Progress Notes (Signed)
On 12/04/21 patient completed Core Session 8 of Diabetes Prevention Program course virtually with [facility]. The following learning objectives were met by the patient during this class:  ? ?Virtual Visit via Video Note ? ?I connected with [name] by a video enabled application and verified that I am speaking with the correct person using two identifiers. ? ?Location: ?Patient: Home.  ?Provider: Office.  ? ?Learning Objectives: ?Recognize positive and negative food and activity cues.  ?Change negative food and activity cues to positive cues.  ?Add positive cues for activity and eliminate cues for inactivity.  ?Develop a plan for removing one problem food cue for the coming week.  ? ?Goals:  ?Record weight taken outside of class.  ?Track foods and beverages eaten each day in the "Food and Activity Tracker," including calories and fat grams for each item.   ?Track activity type, minutes you were active, and distance you reached each day in the "Food and Activity Tracker."  ?Set aside one 20 to 30-minute block of time every day or find two or more periods of 10 to15 minutes each for physical activity.  ?Remove one problem food cue.  ?Add one positive cue for being more active. ? ?Follow-Up Plan: ?Attend Core Session 9 next week.  ?Email completed "Food and Activity Tracker" next week to be reviewed by Lifestyle Coach. ? ?

## 2021-12-25 ENCOUNTER — Encounter: Payer: Self-pay | Admitting: Registered"

## 2021-12-25 ENCOUNTER — Encounter: Payer: BC Managed Care – PPO | Attending: Nurse Practitioner | Admitting: Registered"

## 2021-12-25 DIAGNOSIS — R7303 Prediabetes: Secondary | ICD-10-CM | POA: Insufficient documentation

## 2021-12-25 NOTE — Progress Notes (Signed)
On 12/25/21 patient completed Core Session 10 of Diabetes Prevention Program course virtually with [facility]. The following learning objectives were met by the patient during this class:  ? ?Virtual Visit via Video Note ? ?I connected with [name] by a video enabled application and verified that I am speaking with the correct person using two identifiers. ? ?Location: ?Patient: Home.  ?Provider: Office.  ? ?Learning Objectives: ?List and describe the four keys for healthy eating out.  ?Give examples of how to apply these keys at the type of restaurants that the participants go to regularly.  ?Make an appropriate meal selection from a restaurant menu.  ?Demonstrate how to ask for a substitute item using assertive language and a polite tone of voice.   ? ?Goals:  ?Record weight taken outside of class.  ?Track foods and beverages eaten each day in the "Food and Activity Tracker," including calories and fat grams for each item.   ?Track activity type, minutes you were active, and distance you reached each day in the "Food and Activity Tracker."  ?Set aside one 20 to 30-minute block of time every day or find two or more periods of 10 to15 minutes each for physical activity.  ?Utilize positive action plan and complete questions on "To Do List."  ? ?Follow-Up Plan: ?Attend Core Session 11.  ?Email completed "Food and Activity Tracker" to be reviewed by Lifestyle Coach. ? ?

## 2022-01-01 ENCOUNTER — Encounter: Payer: BC Managed Care – PPO | Admitting: Registered"

## 2022-01-01 DIAGNOSIS — R7303 Prediabetes: Secondary | ICD-10-CM

## 2022-01-08 ENCOUNTER — Encounter: Payer: BC Managed Care – PPO | Admitting: Registered"

## 2022-01-08 DIAGNOSIS — R7303 Prediabetes: Secondary | ICD-10-CM

## 2022-01-11 ENCOUNTER — Encounter: Payer: Self-pay | Admitting: Registered"

## 2022-01-11 NOTE — Progress Notes (Signed)
On 01/01/22 patient completed Session 11 of Diabetes Prevention Program course virtually with [facility]. By the end of this session patients are able to complete the following objectives:  ? ?Virtual Visit via Video Note ? ?I connected with [name] by a video enabled application and verified that I am speaking with the correct person using two identifiers. ? ?Location: ?Patient: Home.  ?Provider: Office.  ? ?Learning Objectives: ?Give examples of negative thoughts that could prevent them from meeting their goals of losing weight and being more physically active.  ?Describe how to stop negative thoughts and talk back to them with positive thoughts.  ?Practice 1) stopping negative thoughts and 2) talking back to negative thoughts with positive ones.  ? ? ?Goals:  ?Record weight taken outside of class.  ?Track foods and beverages eaten each day in the "Food and Activity Tracker," including calories and fat grams for each item.   ?Track activity type, minutes you were active, and distance you reached each day in the "Food and Activity Tracker."  ?If you have any negative thoughts-write them in your Food and Activity Trackers, along with how you talked back to them. Practice stopping negative thoughts and talking back to them with positive thoughts.  ? ?Follow-Up Plan: ?Attend Core Session 12 next week.  ?Email completed "Food and Activity Tracker" before next week to be reviewed by Lifestyle Coach. ? ?

## 2022-01-15 ENCOUNTER — Encounter: Payer: BC Managed Care – PPO | Attending: Nurse Practitioner | Admitting: Registered"

## 2022-01-15 DIAGNOSIS — R7303 Prediabetes: Secondary | ICD-10-CM | POA: Insufficient documentation

## 2022-01-18 ENCOUNTER — Encounter: Payer: Self-pay | Admitting: Registered"

## 2022-01-18 NOTE — Progress Notes (Signed)
Patient was seen on 01/08/22 for the Core Session 12 of Diabetes Prevention Program course at [facility]. By the end of this session patients are able to complete the following objectives:  ? ?Virtual Visit via Video Note ? ?I connected with [name] by a video enabled application and verified that I am speaking with the correct person using two identifiers. ? ?Location: ?Patient: Home.  ?Provider: Office.  ? ?Learning Objectives: ?Describe their current progress toward defined goals. ?Describe common causes for slipping from healthy eating or being ?active. ?Explain what to do to get back on their feet after a slip. ? ?Goals:  ?Record weight taken outside of class.  ?Track foods and beverages eaten each day in the "Food and Activity Tracker," including calories and fat grams for each item.   ?Track activity type, minutes active, and distance reached each day in the "Food and Activity Tracker."  ?Try out the two action plans created during session- ?Slips from Healthy Eating: Action Plan? and ?Slips from Being Active: Action Plan? ?Answer questions on the handout.  ? ?Follow-Up Plan: ?Attend Core Session 13 next week.  ?Bring completed "Food and Activity Tracker" next week to be reviewed by Lifestyle Coach. ? ?

## 2022-01-21 ENCOUNTER — Encounter: Payer: Self-pay | Admitting: Registered"

## 2022-01-21 NOTE — Progress Notes (Signed)
On 01/12/22 patient attended a virtual grocery store tour session as part of the Diabetes Prevention Program with [facility]. ? ?Virtual Visit via Video Note ? ?I connected with [name] by a video enabled application and verified that I am speaking with the correct person using two identifiers. ? ?Location: ?Patient: Home.  ?Provider: Office.  ? ? Learning Objectives: ?Develop a plan for our grocery shopping experience ?Putting together a list ?How to navigate the grocery store ?Identify 4 main sections of the grocery store ?Produce ?Meat/Poultry/Fish ?Dairy  ?Inside Aisles ?Consider tips for shopping in each of these four sections ?Reflect on our own shopping habits ?Create a new goal for our next grocery shopping experience ?Engage in a group discussion ? ?Goals:  ?Record weight taken outside of class.  ?Track foods and beverages eaten each day in the "Food and Activity Tracker," including calories and fat grams for each item.  ?Create one new goal for the next grocery shopping experience based on the information provided today ? ?Follow-Up Plan: ?Attend next session.  ?Email completed "Food and Activity Tracker" before next session to be reviewed by Lifestyle Coach.   ?

## 2022-01-22 ENCOUNTER — Encounter: Payer: Self-pay | Admitting: Dietician

## 2022-01-22 ENCOUNTER — Encounter: Payer: BC Managed Care – PPO | Admitting: Dietician

## 2022-01-22 DIAGNOSIS — R7303 Prediabetes: Secondary | ICD-10-CM

## 2022-01-22 NOTE — Progress Notes (Signed)
On 01/22/2022 patient completed a post core session of the Diabetes Prevention Program course virtually with [facility]. By the end of this session patients are able to complete the following objectives:  ? ?Virtual Visit via Video Note ? ?I connected with Ashley [employer]ox. [employer] by a video enabled application and verified that I am speaking with the correct person using two identifiers. ?  ?I discussed the limitations of evaluation and management by telemedicine and the availability of in person appointments. The patient expressed understanding and agreed to proceed. ? ?Location: ?Patient: home (virtual) ?Provider: office ? ?Learning Objectives: ?Identify which foods contain carbohydrates.  ?List functions for carbohydrates on the body.  ?Describe the relationship between carbohydrate intake and blood sugar.  ?Create balanced snack choices.  ? ?Goals:  ?Record weight taken outside of class.  ?Track foods and beverages eaten each day in the "Food and Activity Tracker," including calories and fat grams for each item.   ?Track activity type, minutes you were active, and distance you reached each day in the "Food and Activity Tracker."  ? ?Follow-Up Plan: ?Attend next session.  ?Email completed "Food and Activity Trackers" before next session to be reviewed by Lifestyle Coach.  ?

## 2022-02-05 ENCOUNTER — Encounter: Payer: Self-pay | Admitting: Registered"

## 2022-02-05 ENCOUNTER — Encounter: Payer: BC Managed Care – PPO | Admitting: Registered"

## 2022-02-05 DIAGNOSIS — R7303 Prediabetes: Secondary | ICD-10-CM

## 2022-02-05 NOTE — Progress Notes (Signed)
On 02/05/22 patient completed the Core Session 13 of Diabetes Prevention Program course virtually with [facility]. By the end of this session patients are able to complete the following objectives:   Virtual Visit via Video Note  I connected with [name] by a video enabled application and verified that I am speaking with the correct person using two identifiers.  Location: Patient: Home. Provider: Office.   Learning Objectives: Describe ways to add interest and variety to their activity plans. Define ?aerobic fitness. Explain the four F.I.T.T. principles (frequency, intensity, time, and type of activity) and how they relate to aerobic fitness.   Goals:  Record weight taken outside of class.  Track foods and beverages eaten each day in the "Food and Activity Tracker," including calories and fat grams for each item.   Track activity type, minutes you were active, and distance you reached each day in the "Food and Activity Tracker."  Do your best to reach activity goal for the week. Use one of the F.I.T.T. principles to jump start workouts. Document activity level on the "To Do Next Week" handout.  Follow-Up Plan: Attend Core Session 14 next week.  Email completed "Food and Activity Tracker" before next week to be reviewed by Lifestyle Coach.

## 2022-02-12 ENCOUNTER — Encounter: Payer: BC Managed Care – PPO | Attending: Family Medicine | Admitting: Registered"

## 2022-02-12 DIAGNOSIS — R7303 Prediabetes: Secondary | ICD-10-CM

## 2022-02-18 NOTE — Progress Notes (Signed)
On 02/12/22 patient completed Core Session 14 of Diabetes Prevention Program course virtually with [facility]. By the end of this session patients are able to complete the following objectives:   Virtual Visit via Video Note  I connected with [name] by a video enabled application and verified that I am speaking with the correct person using two identifiers.  Location: Patient: Home.  Provider: Office.   Learning Objectives: Give examples of problem social cues and helpful social cues.  Explain how to remove problem social cues and add helpful ones.  Describe ways of coping with vacations and social events such as parties, holidays, and visits from relatives and friends.  Create an action plan to change a problem social cue and add a helpful one.   Goals:  Record weight taken outside of class.  Track foods and beverages eaten each day in the "Food and Activity Tracker," including calories and fat grams for each item.   Track activity type, minutes you were active, and distance you reached each day in the "Food and Activity Tracker."  Do your best to reach activity goal for the week. Use action plan created during session to change a problem social cue and add a helpful social cue.  Answer questions regarding success of changing social cues on "To Do Next Week" handout.   Follow-Up Plan: Attend Core Session 15 next week.  Email completed "Food and Activity Tracker" before next week to be reviewed by Lifestyle Coach.

## 2022-02-19 ENCOUNTER — Encounter: Payer: BC Managed Care – PPO | Attending: Family Medicine | Admitting: Registered"

## 2022-02-19 DIAGNOSIS — R7303 Prediabetes: Secondary | ICD-10-CM

## 2022-02-26 ENCOUNTER — Encounter: Payer: Self-pay | Admitting: Registered"

## 2022-02-26 NOTE — Progress Notes (Signed)
On 02/19/22 patient completed Core Session 15 of Diabetes Prevention Program course virtually with [facility]. By the end of this session patients are able to complete the following objectives:   Virtual Visit via Video Note  I connected with [name] by a video enabled application and verified that I am speaking with the correct person using two identifiers.  Location: Patient: Home.  Provider: Office.   Learning Objectives: Explain how to prevent stress or cope with unavoidable stress.  Describe how this program can be a source of stress.  Explain how to manage stressful situations.  Create and follow an action plan for either preventing or coping with a stressful situation.   Goals:  Record weight taken outside of class.  Track foods and beverages eaten each day in the "Food and Activity Tracker," including calories and fat grams for each item.   Track activity type, minutes you were active, and distance you reached each day in the "Food and Activity Tracker."  Do your best to reach activity goal for the week. Follow your action plan to reduce stress.  Answer questions on handout regarding success of action plan.   Follow-Up Plan: Attend Core Session 16 next week.  Email completed "Food and Activity Tracker" before next week to be reviewed by Lifestyle Coach.

## 2022-03-19 ENCOUNTER — Encounter: Payer: BC Managed Care – PPO | Attending: Family Medicine | Admitting: Registered"

## 2022-03-19 DIAGNOSIS — R7303 Prediabetes: Secondary | ICD-10-CM | POA: Insufficient documentation

## 2022-03-25 ENCOUNTER — Encounter: Payer: Self-pay | Admitting: Registered"

## 2022-03-25 NOTE — Progress Notes (Signed)
On 03/19/22 patient completed a post core session of the Diabetes Prevention Program course virtually with [facility]. By the end of this session patients are able to complete the following objectives:   Virtual Visit via Video Note  I connected with [name] by a video enabled application and verified that I am speaking with the correct person using two identifiers.  Location: Patient: Home.  Provider: Office.   Learning Objectives: Identify how to maintain and/or continue working toward program goals for the remainder of the program.  Describe ways that food and activity tracking can assist them in maintaining/reaching program goals.  Identify progress they have made since the beginning of the program.   Goals:  Record weight taken outside of class.  Track foods and beverages eaten each day in the "Food and Activity Tracker," including calories and fat grams for each item.   Track activity type, minutes you were active, and distance you reached each day in the "Food and Activity Tracker."   Follow-Up Plan: Attend session 18 in two weeks.  Email completed "Food and Activity Trackers" before next session to be reviewed by Lifestyle Coach.

## 2022-04-16 ENCOUNTER — Encounter: Payer: BC Managed Care – PPO | Attending: Family Medicine | Admitting: Registered"

## 2022-04-16 ENCOUNTER — Encounter: Payer: Self-pay | Admitting: Registered"

## 2022-04-16 DIAGNOSIS — R7303 Prediabetes: Secondary | ICD-10-CM | POA: Insufficient documentation

## 2022-04-16 NOTE — Progress Notes (Signed)
On 04/16/22 patient completed a post core session of the Diabetes Prevention Program course virtually with [facility]. By the end of this session patients are able to complete the following objectives:   Virtual Visit via Video Note  I connected with [name] by a video enabled application and verified that I am speaking with the correct person using two identifiers.  Location: Patient: Home.  Provider: Office.   Learning Objectives: List ways to make recipes healthier.  List lower-fat and lower-calorie substitutions for common ingredients.  Identify low-fat cooking methods.  Describe how to choose a cookbook that works best for their needs.   Goals:  Record weight taken outside of class.  Track foods and beverages eaten each day in the "Food and Activity Tracker," including calories and fat grams for each item.   Track activity type, minutes you were active, and distance you reached each day in the "Food and Activity Tracker."   Follow-Up Plan: Attend next session.  Email completed "Food and Activity Trackers" before next session to be reviewed by Lifestyle Coach.

## 2022-04-30 ENCOUNTER — Encounter: Payer: BC Managed Care – PPO | Attending: Family Medicine | Admitting: Registered"

## 2022-04-30 DIAGNOSIS — R7303 Prediabetes: Secondary | ICD-10-CM | POA: Insufficient documentation

## 2022-05-11 ENCOUNTER — Encounter: Payer: Self-pay | Admitting: Registered"

## 2022-05-11 NOTE — Progress Notes (Signed)
On 04/30/22 patient completed a post core session of the Diabetes Prevention Program course virtually with [facility]. By the end of this session patients are able to complete the following objectives:   Virtual Visit via Video Note  I connected with [name] by a video enabled application and verified that I am speaking with the correct person using two identifiers.  Location: Patient: Home.  Provider: Office.   Learning Objectives: Describe the importance of having regular meals each day and how skipping meals can negatively affect food choices and weight.  Plan out balanced meals and snacks. List ways to avoid unplanned snacking.   Goals:  Record weight taken outside of class.  Track foods and beverages eaten each day in the "Food and Activity Tracker," including calories and fat grams for each item.   Track activity type, minutes you were active, and distance you reached each day in the "Food and Activity Tracker."   Follow-Up Plan: Attend next session.  Email completed "Food and Activity Trackers" before next session to be reviewed by Lifestyle Coach.

## 2022-07-16 ENCOUNTER — Encounter: Payer: BC Managed Care – PPO | Admitting: Registered"

## 2022-07-23 ENCOUNTER — Encounter: Payer: BC Managed Care – PPO | Attending: Family Medicine | Admitting: Registered"

## 2022-07-23 DIAGNOSIS — R7303 Prediabetes: Secondary | ICD-10-CM | POA: Insufficient documentation

## 2022-07-29 ENCOUNTER — Encounter: Payer: Self-pay | Admitting: Registered"

## 2022-07-29 NOTE — Progress Notes (Signed)
On 07/23/22 patient completed the Diabetes Prevention Program course virtually with [facility]. By the end of this session patients are able to complete the following objectives:   Virtual Visit via Video Note  I connected with [name] by a video enabled application and verified that I am speaking with the correct person using two identifiers.  Location: Patient: Home.  Provider: Office.  Learning Objectives: Define fiber and describe the difference between insoluble and soluble fiber  List foods that are good sources of fiber Explain the health benefits of fiber  Describe ways to increase volume of meals and snacks while staying within fat goal.   Goals:  Record weight taken outside of class.  Track foods and beverages eaten each day in the "Food and Activity Tracker," including calories and fat grams for each item.   Track activity type, minutes you were active, and distance you reached each day in the "Food and Activity Tracker."   Follow-Up Plan: Attend next session.  Email completed "Food and Activity Trackers" before next session to be reviewed by Lifestyle Coach.

## 2022-07-30 ENCOUNTER — Encounter: Payer: BC Managed Care – PPO | Attending: Family Medicine | Admitting: Registered"

## 2022-07-30 ENCOUNTER — Encounter: Payer: Self-pay | Admitting: Registered"

## 2022-07-30 DIAGNOSIS — R7303 Prediabetes: Secondary | ICD-10-CM | POA: Insufficient documentation

## 2022-07-30 NOTE — Progress Notes (Signed)
On 07/30/22 patient completed a post core session of the Diabetes Prevention Program course virtually with [facility]. By the end of this session patients are able to complete the following objectives:   Virtual Visit via Video Note  I connected with [name] by a video enabled application and verified that I am speaking with the correct person using two identifiers.  Location: Patient: Home.  Provider: Office.   Learning Objectives: List 9 ways to to stay on track during special events/occasions. Create a plan to avoid a food problem at an upcoming special event/occasion Describe ways to make time for healthy behaviors during special events/occasions   Goals:  Record weight taken outside of class.  Track foods and beverages eaten each day in the "Food and Activity Tracker," including calories and fat grams for each item.   Track activity type, minutes you were active, and distance you reached each day in the "Food and Activity Tracker."   Follow-Up Plan: Attend next session.  Email completed "Food and Activity Trackers" before next session to be reviewed by Lifestyle Coach.

## 2022-08-27 ENCOUNTER — Encounter: Payer: BC Managed Care – PPO | Admitting: Registered"

## 2022-10-01 ENCOUNTER — Encounter: Payer: BC Managed Care – PPO | Admitting: Registered"

## 2022-10-29 ENCOUNTER — Encounter: Payer: BC Managed Care – PPO | Attending: Family Medicine | Admitting: Registered"

## 2022-10-29 ENCOUNTER — Encounter: Payer: Self-pay | Admitting: Registered"

## 2022-10-29 DIAGNOSIS — R7303 Prediabetes: Secondary | ICD-10-CM

## 2022-10-29 NOTE — Progress Notes (Signed)
On 10/29/22 pt completed a post core session of the Diabetes Prevention Program course virtually with [facility]. By the end of this session patients are able to complete the following objectives:   Virtual Visit via Video Note  I connected with [name] by a video enabled application and verified that I am speaking with the correct person using two identifiers.  Location: Patient: Home.  Provider: Office.   Learning Objectives: Reflect on lifestyle changes they have made since starting the DPP.  Set long-term goals to promote continued maintenance of lifestyle changes made during the program.   Goals:  Work toward reaching new long-term goals set during class.   Follow-Up Plan: Contact Lifestyle Coach with questions/concerns PRN.
